# Patient Record
Sex: Male | Born: 1955 | Marital: Married | Smoking: Never smoker
Health system: Southern US, Community
[De-identification: ages and names within clinical notes are randomized; demographics above are authoritative.]

## PROBLEM LIST (undated history)

## (undated) ENCOUNTER — Emergency Department (HOSPITAL_COMMUNITY): Payer: Self-pay | Source: Home / Self Care

## (undated) DIAGNOSIS — E119 Type 2 diabetes mellitus without complications: Secondary | ICD-10-CM

## (undated) HISTORY — DX: Type 2 diabetes mellitus without complications: E11.9

---

## 2015-05-05 ENCOUNTER — Encounter: Payer: Self-pay | Admitting: Podiatry

## 2015-05-05 ENCOUNTER — Ambulatory Visit (INDEPENDENT_AMBULATORY_CARE_PROVIDER_SITE_OTHER): Payer: Medicare Other | Admitting: Podiatry

## 2015-05-05 ENCOUNTER — Ambulatory Visit (INDEPENDENT_AMBULATORY_CARE_PROVIDER_SITE_OTHER): Payer: Medicare Other

## 2015-05-05 VITALS — BP 157/82 | HR 75 | Temp 97.8°F | Resp 16

## 2015-05-05 DIAGNOSIS — Z899 Acquired absence of limb, unspecified: Secondary | ICD-10-CM | POA: Diagnosis not present

## 2015-05-05 DIAGNOSIS — R52 Pain, unspecified: Secondary | ICD-10-CM

## 2015-05-05 DIAGNOSIS — L97529 Non-pressure chronic ulcer of other part of left foot with unspecified severity: Secondary | ICD-10-CM

## 2015-05-05 DIAGNOSIS — E1149 Type 2 diabetes mellitus with other diabetic neurological complication: Secondary | ICD-10-CM

## 2015-05-05 DIAGNOSIS — E11621 Type 2 diabetes mellitus with foot ulcer: Secondary | ICD-10-CM | POA: Diagnosis not present

## 2015-05-05 MED ORDER — CLINDAMYCIN HCL 300 MG PO CAPS: 300.0000 mg | ORAL_CAPSULE | Freq: Three times a day (TID) | ORAL | Status: DC

## 2015-05-05 MED ORDER — SILVER SULFADIAZINE 1 % EX CREA: 1.0000 "application " | TOPICAL_CREAM | Freq: Every day | CUTANEOUS | Status: AC

## 2015-05-05 MED ORDER — CLINDAMYCIN HCL 300 MG PO CAPS: 300.0000 mg | ORAL_CAPSULE | Freq: Two times a day (BID) | ORAL | Status: DC

## 2015-05-05 NOTE — Progress Notes (Signed)
   Subjective:    Patient ID: Mitchell Davies, male    DOB: 02/18/1956, 59 y.o.   MRN: [id number]  HPI   59 year old male presents the office they with his wife for concerns of 2 ulcers the top of his left foot which has been ongoing for approximate one week. He states the areas didn't start as a blister. He has noticed some brownish drainage coming from the wound but denies any pus. Denies any surrounding redness or red streaks. He denies been on any antibiotics recently. He does. 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 No other complaints at this time.  Review of Systems  Constitutional: Positive for unexpected weight change.  HENT: Positive for sneezing.        Sinus problems,  Eyes: Positive for pain and visual disturbance.  Respiratory: Positive for cough, shortness of breath and wheezing.   Cardiovascular: Positive for palpitations and leg swelling.       Calf pain with walking  Gastrointestinal: Positive for nausea, diarrhea and constipation.  Genitourinary: Positive for urgency.  Musculoskeletal: Positive for gait problem.       Muscle pain,Joint pain  Skin:       Open sores  Allergic/Immunologic: Positive for environmental allergies and food allergies.  Neurological: Positive for weakness, numbness and headaches.       Objective:   Physical Exam General: AAO x3, NAD  Dermatological:  On the dorsal aspect of the left foot there are 2 annular superficial granular ulcerations. They are on the dorsal 1st MTPJ. On the more dorsal wound the wound measures 1.5 x 1 cm on the dorsal medial aspect measured 0.5 x 0.5 L. The wound bases are superficial and granular. There is no swelling erythema, ascending cellulitis, fluctuance, crepitus, malodor, drainage/purulence. There appears to be chronic edema to the left foot. There are no other open lesions or pre-ulcerative lesions.  Vascular: Dorsalis Pedis artery and Posterior Tibial artery pedal pulses  are 2/4 bilateral with immedate capillary fill time. There is no pain with calf compression, swelling, warmth, erythema.   Neruologic:  Absent protective and vibratory sensation.  Musculoskeletal:  Previous limitation of the left hallux as well as a right below-knee amputation.  Gait: Unassisted, Nonantalgic.      Assessment & Plan:   59 year old male left dorsal foot ulcerations -X-rays were obtained and reviewed with the patient.  There is no definitive evidence of acute fracture or stress fracture. -Treatment options discussed including all alternatives, risks, and complications -Given his history of drainage will start antibiotics. Prescribed clindamycin. -Recommend daily dressing changes with Silvadene. This is prescribed today. They also have some at home.  -Offloading the area. -Completed paperwork for precertification diabetic shoe -Monitor for any clinical signs or symptoms of infection and directed to call the office immediately should any occur or go to the ER. -Follow-up in 3 weeks or sooner if any problems arise. In the meantime, encouraged to call the office with any questions, concerns, change in symptoms. Follow-up with PCP for other issues mentioned in the review of systems.  Ovid CurdMatthew Nicolas Banh, DPM

## 2015-05-05 NOTE — Patient Instructions (Signed)
Monitor for any signs/symptoms of infection. Call the office immediately if any occur or go directly to the emergency room. Call with any questions/concerns.  

## 2015-05-06 ENCOUNTER — Encounter: Payer: Self-pay | Admitting: Podiatry

## 2015-05-06 DIAGNOSIS — L97529 Non-pressure chronic ulcer of other part of left foot with unspecified severity: Secondary | ICD-10-CM

## 2015-05-06 DIAGNOSIS — E11621 Type 2 diabetes mellitus with foot ulcer: Secondary | ICD-10-CM | POA: Insufficient documentation

## 2015-05-06 DIAGNOSIS — E1149 Type 2 diabetes mellitus with other diabetic neurological complication: Secondary | ICD-10-CM | POA: Insufficient documentation

## 2015-05-06 DIAGNOSIS — Z899 Acquired absence of limb, unspecified: Secondary | ICD-10-CM | POA: Insufficient documentation

## 2015-05-26 ENCOUNTER — Encounter: Payer: Self-pay | Admitting: Podiatry

## 2015-05-26 ENCOUNTER — Ambulatory Visit (INDEPENDENT_AMBULATORY_CARE_PROVIDER_SITE_OTHER): Payer: Medicare Other | Admitting: Podiatry

## 2015-05-26 VITALS — BP 141/83 | HR 56 | Resp 18

## 2015-05-26 DIAGNOSIS — Z899 Acquired absence of limb, unspecified: Secondary | ICD-10-CM

## 2015-05-26 DIAGNOSIS — E11621 Type 2 diabetes mellitus with foot ulcer: Secondary | ICD-10-CM | POA: Diagnosis not present

## 2015-05-26 DIAGNOSIS — L97529 Non-pressure chronic ulcer of other part of left foot with unspecified severity: Secondary | ICD-10-CM

## 2015-05-28 ENCOUNTER — Encounter: Payer: Self-pay | Admitting: Podiatry

## 2015-05-28 NOTE — Progress Notes (Signed)
Patient ID: Mitchell Davies, male   DOB: 09/29/1955, 59 y.o.   MRN: [id number]  Subjective: 59 year old male presents the office for follow-up evaluation of left foot ulcerations. He states in the wounds on the top of the foot have decrease in her resolving. He does state the swelling to his foot is also decreased.he has been using Silvadene daily He has been as his course of antibiotics. Denies any systemic complaints as fevers, chills, nausea, vomiting. No calf pain, chest pain, shortness of breath.  Objective: AAO 3, NAD  DP/PT pulses palpable, CRT less than 3 seconds Previous right below-knee amputation Protective sensation absent with Mitchell Davies Weinstein monofilament and vibratory sensation. On the dorsal aspect of the left foot the more dorsal wound appears to be decreased in size today measuring 0.9 x 0.4 cm in the superficial without any swelling erythema, ascending cellulitis, questions, crepitus, malodor, drainage or purulence. On the medial dorsal wound is a hyperkeratotic lesion. Upon debridement underlying ulceration appears to be almost healed is only a small pinpoint opening. Again there is no probing, undermining or drainage or other signs of infection. The edema to the foot has decreased. No other open lesions or pre-ulcerative lesions. No pain with calf compression, swelling, warmth, erythema  Assessment: 59 year old male resolving ulcers left foot  Plan: -Treatment options discussed including all alternatives, risks, and complications -Once debrided to healthy wound base. Continue Silvadene dressing changes daily. Monitor for any clinical signs or symptoms of infection and directed to call the office immediately should any occur or go to the ER. -Follow-up as scheduled or sooner if any problems arise. In the meantime, encouraged to call the office with any questions, concerns, change in symptoms.   Ovid CurdMatthew Wagoner, DPM

## 2015-06-16 ENCOUNTER — Encounter: Payer: Self-pay | Admitting: Podiatry

## 2015-06-16 ENCOUNTER — Ambulatory Visit (INDEPENDENT_AMBULATORY_CARE_PROVIDER_SITE_OTHER): Payer: Medicare Other | Admitting: Podiatry

## 2015-06-16 VITALS — BP 163/86 | HR 66 | Temp 96.3°F | Resp 16

## 2015-06-16 DIAGNOSIS — L97529 Non-pressure chronic ulcer of other part of left foot with unspecified severity: Principal | ICD-10-CM

## 2015-06-16 DIAGNOSIS — L89891 Pressure ulcer of other site, stage 1: Secondary | ICD-10-CM

## 2015-06-16 DIAGNOSIS — E11621 Type 2 diabetes mellitus with foot ulcer: Secondary | ICD-10-CM

## 2015-06-17 ENCOUNTER — Encounter: Payer: Self-pay | Admitting: Podiatry

## 2015-06-17 NOTE — Progress Notes (Signed)
Patient ID: Leonidas Boateng, male   DOB: September 20, 1955, 60 y.o.   MRN: [id number]  Subjective: 60 year old male presents the office for follow-up evaluation of left foot ulcerations. He has continued  With Silvadene to the wound daily. He said the wound does  Continue to heal. Denies any drainage or pus. Denies he redness or warmth to his foot. Denies any systemic complaints as fevers, chills, nausea, vomiting. No calf pain, chest pain, shortness of breath.  Objective: AAO 3, NAD  DP/PT pulses palpable, CRT less than 3 seconds Previous right below-knee amputation Protective sensation absent with Dorann Ou monofilament and vibratory sensation. On the dorsal aspect of the left foot the more dorsal wound appears to be decreased in size today measuring 0.6 x 0.1 cm in the superficial without any surrounding erythema, ascending cellulitis, questions, crepitus, malodor, drainage or purulence. On the medial dorsal wound is a hyperkeratotic lesion. Upon debridement underlying ulceration appears to be healed at this time. Again there is no probing, undermining or drainage or other signs of infection. No significant edema to the foot and there is no warmth to the foot.  No other open lesions or pre-ulcerative lesions. No pain with calf compression, swelling, warmth, erythema  Assessment: 60 year old male resolving ulcers left foot  Plan: -Treatment options discussed including all alternatives, risks, and complications -Once debrided to healthy wound base. Continue Silvadene dressing changes daily. Monitor for any clinical signs or symptoms of infection and directed to call the office immediately should any occur or go to the ER. -Follow-up as scheduled or sooner if any problems arise. In the meantime, encouraged to call the office with any questions, concerns, change in symptoms.   Ovid Curd, DPM

## 2015-06-30 ENCOUNTER — Ambulatory Visit: Payer: Medicare Other | Admitting: *Deleted

## 2015-06-30 DIAGNOSIS — E11621 Type 2 diabetes mellitus with foot ulcer: Secondary | ICD-10-CM

## 2015-06-30 DIAGNOSIS — L97529 Non-pressure chronic ulcer of other part of left foot with unspecified severity: Principal | ICD-10-CM

## 2015-06-30 NOTE — Progress Notes (Signed)
Patient ID: Mitchell Davies, male   DOB: [birth year], 60 y.o.   MRN: [id number] Patient presents to be scanned and measured for diabetic shoes and inserts.

## 2015-07-14 ENCOUNTER — Ambulatory Visit: Payer: Medicare Other | Admitting: Podiatry

## 2015-08-24 ENCOUNTER — Ambulatory Visit: Payer: Medicare Other

## 2015-08-25 ENCOUNTER — Ambulatory Visit: Payer: Medicare Other

## 2015-08-28 ENCOUNTER — Ambulatory Visit (INDEPENDENT_AMBULATORY_CARE_PROVIDER_SITE_OTHER): Payer: Medicare Other | Admitting: Podiatry

## 2015-08-28 DIAGNOSIS — E1149 Type 2 diabetes mellitus with other diabetic neurological complication: Secondary | ICD-10-CM | POA: Diagnosis not present

## 2015-08-28 DIAGNOSIS — Z89431 Acquired absence of right foot: Secondary | ICD-10-CM

## 2015-08-28 DIAGNOSIS — M2042 Other hammer toe(s) (acquired), left foot: Secondary | ICD-10-CM

## 2015-08-28 NOTE — Progress Notes (Signed)
Patient ID: Mitchell GrippeKevin Glenn Davies, male   DOB: 12/15/1955, 60 y.o.   MRN: [id number] Patient presents for diabetic shoe pick up, shoes are tried on for good fit.  Patient received 1 Pair [employer] [address] black in men's size 14 extra extra wide and 3 custom molded diabetic inserts.  Patient has right lower leg amputation.  Verbal and written break in and wear instructions given.  Patient will follow up for scheduled routine care. He had no other complaints today and denies any acute changes.

## 2015-08-28 NOTE — Patient Instructions (Signed)

## 2015-12-07 ENCOUNTER — Ambulatory Visit (INDEPENDENT_AMBULATORY_CARE_PROVIDER_SITE_OTHER): Payer: Medicare Other | Admitting: Podiatry

## 2015-12-07 ENCOUNTER — Encounter: Payer: Self-pay | Admitting: Podiatry

## 2015-12-07 ENCOUNTER — Ambulatory Visit (INDEPENDENT_AMBULATORY_CARE_PROVIDER_SITE_OTHER): Payer: Medicare Other

## 2015-12-07 VITALS — BP 149/80 | HR 64 | Resp 12

## 2015-12-07 DIAGNOSIS — L03032 Cellulitis of left toe: Secondary | ICD-10-CM

## 2015-12-07 DIAGNOSIS — M2042 Other hammer toe(s) (acquired), left foot: Secondary | ICD-10-CM | POA: Diagnosis not present

## 2015-12-07 DIAGNOSIS — L97521 Non-pressure chronic ulcer of other part of left foot limited to breakdown of skin: Secondary | ICD-10-CM

## 2015-12-07 MED ORDER — AMOXICILLIN-POT CLAVULANATE 875-125 MG PO TABS: 1.0000 | ORAL_TABLET | Freq: Two times a day (BID) | ORAL | Status: DC

## 2015-12-07 NOTE — Progress Notes (Signed)
Patient ID: Mitchell Davies, male   DOB: 06/15/1955, 60 y.o.   MRN: [id number]  Subjective: 60 year old male presents the office today for concerns of the wound to left second toe which has been ongoing for about 1 week. His wife is no some bloody drainage coming from the toe as well as swelling to the toe. He denies any injury to the toe. Denies any red streaks or any swelling to the other areas of the foot. The wounds that he previously did heel is done well from this. The patient's wife does not think this came for diabetic shoes but is unsure of how it started. Denies he redness or warmth to his foot. Denies any systemic complaints as fevers, chills, nausea, vomiting. No calf pain, chest pain, shortness of breath.  Objective: AAO 3, NAD  DP/PT pulses palpable, CRT less than 3 seconds Previous right below-knee amputation Protective sensation absent with Dorann OuSimms Weinstein monofilament and vibratory sensation. Wounds that were present previously have healed. At the distal aspect of the left second toe is what appears to be an old blister. Upon debridement there is a superficial granular wound site measuring 1 x 0.5 cm there is no probing to bone, undermining or tunneling. There is no surrounding erythema or increase in warmth. No ascending synovitis. Mild edema to the toe. No other open lesions or pre-ulcerative lesions identified. No pain with calf compression, swelling, warmth, erythema  Assessment: 60 year old male second edema, ulceration  Plan: -Treatment options discussed including all alternatives, risks, and complications -Etiology of symptoms were discussed -X-rays were obtained and reviewed with the patient. The definitive evidence of cortical changes suggestive osteomy let us this time. -Wound was debrided without couple complications. Continue in about ointment dressing changes of the wound daily. -Will start Augmentin -Offloading -We're recast him for diabetic inserts to further  offload this area. -Follow up in 2 weeks or sooner if needed. He signs or symptoms of worsening infection of the ER.  Ovid CurdMatthew Wagoner, DPM

## 2015-12-21 ENCOUNTER — Ambulatory Visit (INDEPENDENT_AMBULATORY_CARE_PROVIDER_SITE_OTHER): Payer: Medicare Other | Admitting: Podiatry

## 2015-12-21 ENCOUNTER — Encounter: Payer: Self-pay | Admitting: Podiatry

## 2015-12-21 DIAGNOSIS — L97521 Non-pressure chronic ulcer of other part of left foot limited to breakdown of skin: Secondary | ICD-10-CM

## 2015-12-21 MED ORDER — AMOXICILLIN-POT CLAVULANATE 875-125 MG PO TABS: 1.0000 | ORAL_TABLET | Freq: Two times a day (BID) | ORAL | 0 refills | Status: AC

## 2015-12-26 NOTE — Progress Notes (Signed)
Patient ID: Mitchell Davies, male   DOB: 1956-05-22, 61 y.o.   MRN: [id number]  Subjective: 60 year old male presents the office today for follow-up evaluation of a wound to the left second toe. He states this been doing much better his continue with Augmentin. Denies any drainage or pus or any swelling to the toe. Denies any systemic complaints as fevers, chills, nausea, vomiting. No calf pain, chest pain, shortness of breath.  Objective: AAO 3, NAD  DP/PT pulses palpable, CRT less than 3 seconds Previous right below-knee amputation Protective sensation absent with Dorann Ou monofilament and vibratory sensation. Wounds that were present previously have healed. At the distal aspect of the left second toe is a superficial granular wound without any probing, undermining or tunneling. There is no swelling erythema, ascending saline disc, fluctuance, crepitus, malodor. There is no edema to the toe. No other open lesions or pre-ulcerative lesions identified. No pain with calf compression, swelling, warmth, erythema  Assessment: 60 year old male second edema, ulceration  Plan: -Treatment options discussed including all alternatives, risks, and complications -Wound was lightly debrided to remove small mild hyperkeratotic tissue and the area. Continue with offloading pads. No clinical signs of infection at this time. Monitor for any signs or symptoms of worsening infection of the ER any occur. Follow-up with me as scheduled or sooner if any issues are to arise. Call any questions or concerns or meantime.  Ovid Curd, DPM

## 2016-01-04 ENCOUNTER — Encounter: Payer: Self-pay | Admitting: Podiatry

## 2016-01-04 ENCOUNTER — Ambulatory Visit (INDEPENDENT_AMBULATORY_CARE_PROVIDER_SITE_OTHER): Payer: Medicare Other | Admitting: Podiatry

## 2016-01-04 DIAGNOSIS — E11621 Type 2 diabetes mellitus with foot ulcer: Secondary | ICD-10-CM | POA: Diagnosis not present

## 2016-01-04 DIAGNOSIS — L89891 Pressure ulcer of other site, stage 1: Secondary | ICD-10-CM

## 2016-01-04 DIAGNOSIS — L97529 Non-pressure chronic ulcer of other part of left foot with unspecified severity: Principal | ICD-10-CM

## 2016-01-04 NOTE — Progress Notes (Signed)
Patient ID: Mitchell Davies, male   DOB: 07/21/1955, 60 y.o.   MRN: [id number]  Subjective: 60 year old male presents the office today for follow-up evaluation of a wound to the left second toe. There is one that he noticed some bloody drainage began to shower and that he is in no drainage. He denies any redness the Laveda Normanoney states the swelling has improved. Denies any pain. Denies any systemic complaints as fevers, chills, nausea, vomiting. No calf pain, chest pain, shortness of breath.  Objective: AAO 3, NAD  DP/PT pulses palpable, CRT less than 3 seconds Previous right below-knee amputation Protective sensation absent with Dorann OuSimms Weinstein monofilament Distal aspect the left second toe is a pinpoint superficial opening. Small mild hyperkeratotic periwound. After debridement the wound measures about 0.1 x 0.1 synovators. There is no probing, undermining or tunneling. There is no drainage or pus expressed. This point edema to the toe without any erythema or ascending cellulitis. There is no malodor. No fluctuance or crepitus. No other open lesions or pre-ulcerative lesions.  No pain with calf compression, swelling, warmth, erythema  Assessment: 60 year old male resolving second digit edema, ulceration  Plan: -Treatment options discussed including all alternatives, risks, and complications -Wound was lightly debrided to remove small mild hyperkeratotic tissue and the area. This was applied followed by dressing. Continue offloading. Continue daily dressing changes as home. -Monitor for signs or symptoms of infection and directed to the ER should any occur call the office. -Follow-up in 4 weeks or sooner if any problems arise. In the meantime, encouraged to call the office with any questions, concerns, change in symptoms.   Mitchell Davies, DPM

## 2016-02-05 ENCOUNTER — Ambulatory Visit: Payer: Medicare Other | Admitting: Podiatry

## 2016-02-08 ENCOUNTER — Ambulatory Visit: Payer: Medicare Other | Admitting: Podiatry

## 2016-02-19 ENCOUNTER — Telehealth: Payer: Self-pay | Admitting: *Deleted

## 2016-02-19 NOTE — Telephone Encounter (Signed)
Please let them know that [place] but I will be more than happy to see him once discharged.

## 2016-02-19 NOTE — Telephone Encounter (Addendum)
Pt's wife, Velna HatchetSheila states [place]. Velna HatchetSheila states they had thought the left 2nd toe had healed,but is now being treated for osteomyelitis and blood. I inform Velna HatchetSheila, of Dr. Gabriel RungWagoner's recommendation. Velna HatchetSheila states pt will be receiving IV antibiotics at home and will have an MRI outside of the hospital, and they will reschedule with Dr. Ardelle AntonWagoner in the future.

## 2016-02-22 ENCOUNTER — Ambulatory Visit: Payer: Medicare Other | Admitting: Podiatry

## 2017-02-07 ENCOUNTER — Ambulatory Visit: Payer: Self-pay | Admitting: Physician Assistant

## 2019-03-22 ENCOUNTER — Inpatient Hospital Stay
Admission: RE | Admit: 2019-03-22 | Discharge: 2019-04-08 | Disposition: A | Discharge status: Rehabilitation Facility | Payer: Medicare Other | Source: Other Acute Inpatient Hospital | Attending: Internal Medicine | Admitting: Internal Medicine

## 2019-03-22 DIAGNOSIS — R0602 Shortness of breath: Secondary | ICD-10-CM

## 2019-03-22 DIAGNOSIS — J9690 Respiratory failure, unspecified, unspecified whether with hypoxia or hypercapnia: Secondary | ICD-10-CM

## 2019-03-22 DIAGNOSIS — R079 Chest pain, unspecified: Secondary | ICD-10-CM

## 2019-03-22 DIAGNOSIS — J189 Pneumonia, unspecified organism: Secondary | ICD-10-CM

## 2019-03-22 LAB — BLOOD GAS, ARTERIAL
Acid-Base Excess: 3.2 mmol/L — ABNORMAL HIGH (ref 0.0–2.0)
Bicarbonate: 29.3 mmol/L — ABNORMAL HIGH (ref 20.0–28.0)
Delivery systems: POSITIVE
Expiratory PAP: 10
FIO2: 0.28
Inspiratory PAP: 12
O2 Saturation: 92.6 %
Patient temperature: 98.6
RATE: 16 resp/min
pCO2 arterial: 63.9 mmHg — ABNORMAL HIGH (ref 32.0–48.0)
pH, Arterial: 7.284 — ABNORMAL LOW (ref 7.350–7.450)
pO2, Arterial: 64.5 mmHg — ABNORMAL LOW (ref 83.0–108.0)

## 2019-03-23 ENCOUNTER — Other Ambulatory Visit (HOSPITAL_COMMUNITY): Payer: Medicare Other

## 2019-03-23 LAB — CBC
HCT: 33.1 % — ABNORMAL LOW (ref 39.0–52.0)
Hemoglobin: 10.3 g/dL — ABNORMAL LOW (ref 13.0–17.0)
MCH: 29.3 pg (ref 26.0–34.0)
MCHC: 31.1 g/dL (ref 30.0–36.0)
MCV: 94 fL (ref 80.0–100.0)
Platelets: 168 10*3/uL (ref 150–400)
RBC: 3.52 MIL/uL — ABNORMAL LOW (ref 4.22–5.81)
RDW: 16.2 % — ABNORMAL HIGH (ref 11.5–15.5)
WBC: 11 10*3/uL — ABNORMAL HIGH (ref 4.0–10.5)
nRBC: 0.2 % (ref 0.0–0.2)

## 2019-03-23 LAB — COMPREHENSIVE METABOLIC PANEL
ALT: 23 U/L (ref 0–44)
AST: 22 U/L (ref 15–41)
Albumin: 2.1 g/dL — ABNORMAL LOW (ref 3.5–5.0)
Alkaline Phosphatase: 182 U/L — ABNORMAL HIGH (ref 38–126)
Anion gap: 7 (ref 5–15)
BUN: 27 mg/dL — ABNORMAL HIGH (ref 8–23)
CO2: 27 mmol/L (ref 22–32)
Calcium: 9 mg/dL (ref 8.9–10.3)
Chloride: 104 mmol/L (ref 98–111)
Creatinine, Ser: 2.46 mg/dL — ABNORMAL HIGH (ref 0.61–1.24)
GFR calc Af Amer: 31 mL/min — ABNORMAL LOW (ref >60)
GFR calc non Af Amer: 27 mL/min — ABNORMAL LOW (ref >60)
Glucose, Bld: 163 mg/dL — ABNORMAL HIGH (ref 70–99)
Potassium: 4.1 mmol/L (ref 3.5–5.1)
Sodium: 138 mmol/L (ref 135–145)
Total Bilirubin: 0.7 mg/dL (ref 0.3–1.2)
Total Protein: 6.8 g/dL (ref 6.5–8.1)

## 2019-03-23 LAB — BLOOD GAS, ARTERIAL
Acid-Base Excess: 3.6 mmol/L — ABNORMAL HIGH (ref 0.0–2.0)
Bicarbonate: 29.6 mmol/L — ABNORMAL HIGH (ref 20.0–28.0)
Delivery systems: POSITIVE
Expiratory PAP: 10
FIO2: 0.3
Inspiratory PAP: 22
O2 Saturation: 94.4 %
Patient temperature: 98.6
RATE: 23 resp/min
pCO2 arterial: 61.8 mmHg — ABNORMAL HIGH (ref 32.0–48.0)
pH, Arterial: 7.301 — ABNORMAL LOW (ref 7.350–7.450)
pO2, Arterial: 70 mmHg — ABNORMAL LOW (ref 83.0–108.0)

## 2019-03-23 LAB — VANCOMYCIN, TROUGH: Vancomycin Tr: 10 ug/mL — ABNORMAL LOW (ref 15–20)

## 2019-03-25 ENCOUNTER — Other Ambulatory Visit (HOSPITAL_COMMUNITY): Payer: Medicare Other

## 2019-03-25 LAB — BLOOD GAS, ARTERIAL
Acid-Base Excess: 6 mmol/L — ABNORMAL HIGH (ref 0.0–2.0)
Bicarbonate: 32.5 mmol/L — ABNORMAL HIGH (ref 20.0–28.0)
Delivery systems: POSITIVE
Expiratory PAP: 10
FIO2: 0.4
Inspiratory PAP: 24
Mode: POSITIVE
O2 Saturation: 93.1 %
Patient temperature: 98.6
RATE: 22 resp/min
pCO2 arterial: 71.9 mmHg (ref 32.0–48.0)
pH, Arterial: 7.277 — ABNORMAL LOW (ref 7.350–7.450)
pO2, Arterial: 72.5 mmHg — ABNORMAL LOW (ref 83.0–108.0)

## 2019-03-25 LAB — BASIC METABOLIC PANEL
Anion gap: 10 (ref 5–15)
BUN: 23 mg/dL (ref 8–23)
CO2: 27 mmol/L (ref 22–32)
Calcium: 8.9 mg/dL (ref 8.9–10.3)
Chloride: 103 mmol/L (ref 98–111)
Creatinine, Ser: 2.16 mg/dL — ABNORMAL HIGH (ref 0.61–1.24)
GFR calc Af Amer: 36 mL/min — ABNORMAL LOW (ref >60)
GFR calc non Af Amer: 31 mL/min — ABNORMAL LOW (ref >60)
Glucose, Bld: 210 mg/dL — ABNORMAL HIGH (ref 70–99)
Potassium: 4.1 mmol/L (ref 3.5–5.1)
Sodium: 140 mmol/L (ref 135–145)

## 2019-03-25 LAB — TROPONIN I (HIGH SENSITIVITY)
Troponin I (High Sensitivity): 84 ng/L — ABNORMAL HIGH (ref <18)
Troponin I (High Sensitivity): 96 ng/L — ABNORMAL HIGH (ref <18)

## 2019-03-25 NOTE — Consult Note (Signed)
Referring Physician: Dr. Daine Gravel. [name] is an 63 y.o. male.                       Chief Complaint: Bradycardia  HPI: 63 years old black male has PMH of acute on chronic respiratory failure with continuous BIPAP use, Pneumonia, diastolic left heart failure, CAD, CABG, Acute on chronic kidney disease, Paroxysmal atrial fibrillation, S/P cardioversion, type 2 DM with hyperglycemia, Severe pulmonary hypertension, Hypothyroidism, Hyperlipidemia, Left AKA and right BKA. He has episodes of sinus bradycardia at rest with preserved systolic blood pressure.  Past Medical History:  Diagnosis Date  . Diabetes mellitus without complication (HCC)     Past surgical history : as per HPI.  No family history on file. Social History:  reports that he has never smoked. He does not have any smokeless tobacco history on file. He reports that he does not drink alcohol or use drugs.  Allergies:  Allergies  Allergen Reactions  . Fexofenadine Hcl Anaphylaxis  . Latex Rash    Tape-adhesive  . Other Hives, Nausea Only and Other (See Comments)    pork  . Benzonatate Swelling  . Fluorescein Other (See Comments)  . Seasonal Ic [Cholestatin]   . Levothyroxine Hives, Itching and Rash    Medications Prior to Admission  Medication Sig Dispense Refill  . amiodarone (PACERONE) 200 MG tablet Take by mouth.    Marland Kitchen amoxicillin-clavulanate (AUGMENTIN) 875-125 MG tablet Take 1 tablet by mouth 2 (two) times daily. 20 tablet 0  . apixaban (ELIQUIS) 5 MG TABS tablet Take by mouth.    Marland Kitchen aspirin EC 81 MG tablet Take by mouth.    Marland Kitchen atorvastatin (LIPITOR) 80 MG tablet Take by mouth.    . cetirizine (ZYRTEC) 10 MG tablet Take by mouth.    . clopidogrel (PLAVIX) 75 MG tablet Take by mouth.    . insulin aspart (NOVOLOG) 100 UNIT/ML injection Inject into the skin.    Marland Kitchen levothyroxine (SYNTHROID, LEVOTHROID) 50 MCG tablet     . metoprolol (LOPRESSOR) 50 MG tablet 2 (two) times daily.     . montelukast  (SINGULAIR) 10 MG tablet Take by mouth.    . silver sulfADIAZINE (SILVADENE) 1 % cream Apply 1 application topically daily. 50 g 0  . torsemide (DEMADEX) 20 MG tablet Take by mouth.      Results for orders placed or performed during the hospital encounter of 03/22/19 (from the past 48 hour(s))  Basic metabolic panel     Status: Abnormal   Collection Time: 03/25/19  8:49 AM  Result Value Ref Range   Sodium 140 135 - 145 mmol/L   Potassium 4.1 3.5 - 5.1 mmol/L   Chloride 103 98 - 111 mmol/L   CO2 27 22 - 32 mmol/L   Glucose, Bld 210 (H) 70 - 99 mg/dL   BUN 23 8 - 23 mg/dL   Creatinine, Ser 9.76 (H) 0.61 - 1.24 mg/dL   Calcium 8.9 8.9 - 73.4 mg/dL   GFR calc non Af Amer 31 (L) >60 mL/min   GFR calc Af Amer 36 (L) >60 mL/min   Anion gap 10 5 - 15    Comment: Performed at [facility], [address], [place], [place] [address]  Blood gas, arterial     Status: Abnormal   Collection Time: 03/25/19  1:53 PM  Result Value Ref Range   FIO2 0.40    Delivery systems BILEVEL POSITIVE AIRWAY PRESSURE  Mode BILEVEL POSITIVE AIRWAY PRESSURE    LHR 22 resp/min   Inspiratory PAP 24    Expiratory PAP 10    pH, Arterial 7.277 (L) 7.350 - 7.450   pCO2 arterial 71.9 (HH) 32.0 - 48.0 mmHg    Comment: RBV, KEISHA CLARKE, RT ON 03/25/19 AT 1400 BY MENDIE CAMPBELL, RRT, RCP   pO2, Arterial 72.5 (L) 83.0 - 108.0 mmHg   Bicarbonate 32.5 (H) 20.0 - 28.0 mmol/L   Acid-Base Excess 6.0 (H) 0.0 - 2.0 mmol/L   O2 Saturation 93.1 %   Patient temperature 98.6    Collection site RIGHT RADIAL    Drawn by DRAWN BY RN    Sample type ARTERIAL DRAW    Allens test (pass/fail) PASS PASS   Dg Chest Port 1 View  Result Date: 03/25/2019 CLINICAL DATA:  Chest pain and shortness of breath. EXAM: PORTABLE CHEST 1 VIEW COMPARISON:  03/23/2019. FINDINGS: Trachea is midline. Heart is enlarged. Diffuse interstitial prominence indistinctness, with somewhat of a nodular appearance. No definite pleural fluid.  IMPRESSION: Diffuse interstitial prominence and indistinctness, with somewhat of a nodular appearance, unchanged from 03/23/2019. Findings may be due to pulmonary edema but other etiologies such as a viral or atypical pneumonia are not excluded. Electronically Signed   By: Lorin Picket M.D.   On: 03/25/2019 10:57    Review Of Systems Constitutional: Positive fever, chills, weight loss or gain. Eyes: No vision change, wears glasses. No discharge or pain. Ears: No hearing loss, No tinnitus. Respiratory: Positive asthma, COPD, pneumonias, shortness of breath. No hemoptysis. Positive OSA. Cardiovascular: Positive chest pain, palpitation, bilateral leg amputations. Gastrointestinal: No nausea, vomiting, diarrhea, constipation. No GI bleed. No hepatitis. Genitourinary: No dysuria, hematuria, kidney stone. No incontinance. Positive Kidney injury. Neurological: No headache, stroke, seizures.  Psychiatry: No psych facility admission for anxiety, depression, suicide. No detox. Skin: No rash. Musculoskeletal: Positive joint pain, no fibromyalgia. No neck pain, back pain. Lymphadenopathy: No lymphadenopathy. Hematology: Positive anemia. No easy bruising.  P: 45, R: 20, BP: 125/80. There were no vitals taken for this visit. There is no height or weight on file to calculate BMI. General appearance: alert, cooperative, appears stated age and moderate distress Head: Normocephalic, atraumatic. Eyes: Brown eyes, pale pink conjunctiva, corneas clear. Neck: No adenopathy, no carotid bruit, no JVD, supple, symmetrical, trachea midline and thyroid not enlarged. Resp: Clear to auscultation bilaterally. Cardio: Regular rate and rhythm, S1, S2 normal, II/VI systolic murmur, no click, rub or gallop GI: Soft, non-tender; bowel sounds normal; no organomegaly. Extremities: Left AKA, Right BKA. No edema, cyanosis or clubbing. Skin: Warm and dry.  Neurologic: Alert and oriented X 0.  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 Consider beta-blocking eye drops change. Recheck TSH and increase synthroid if needed.   Time spent: Review of old records, Lab, x-rays, EKG, other cardiac tests, examination, discussion with patient over 70 minutes.  Birdie Riddle, MD  03/25/2019, 6:18 PM

## 2019-03-26 LAB — BASIC METABOLIC PANEL
Anion gap: 11 (ref 5–15)
BUN: 25 mg/dL — ABNORMAL HIGH (ref 8–23)
CO2: 28 mmol/L (ref 22–32)
Calcium: 9 mg/dL (ref 8.9–10.3)
Chloride: 103 mmol/L (ref 98–111)
Creatinine, Ser: 2.53 mg/dL — ABNORMAL HIGH (ref 0.61–1.24)
GFR calc Af Amer: 30 mL/min — ABNORMAL LOW (ref >60)
GFR calc non Af Amer: 26 mL/min — ABNORMAL LOW (ref >60)
Glucose, Bld: 123 mg/dL — ABNORMAL HIGH (ref 70–99)
Potassium: 3.5 mmol/L (ref 3.5–5.1)
Sodium: 142 mmol/L (ref 135–145)

## 2019-03-26 LAB — BLOOD GAS, ARTERIAL
Acid-Base Excess: 7.8 mmol/L — ABNORMAL HIGH (ref 0.0–2.0)
Bicarbonate: 33.3 mmol/L — ABNORMAL HIGH (ref 20.0–28.0)
O2 Content: 4 L/min
O2 Saturation: 95.3 %
Patient temperature: 98.6
pCO2 arterial: 61.7 mmHg — ABNORMAL HIGH (ref 32.0–48.0)
pH, Arterial: 7.352 (ref 7.350–7.450)
pO2, Arterial: 77 mmHg — ABNORMAL LOW (ref 83.0–108.0)

## 2019-03-26 LAB — TROPONIN I (HIGH SENSITIVITY): Troponin I (High Sensitivity): 69 ng/L — ABNORMAL HIGH (ref <18)

## 2019-03-27 LAB — CBC
HCT: 30.2 % — ABNORMAL LOW (ref 39.0–52.0)
Hemoglobin: 9.1 g/dL — ABNORMAL LOW (ref 13.0–17.0)
MCH: 28.7 pg (ref 26.0–34.0)
MCHC: 30.1 g/dL (ref 30.0–36.0)
MCV: 95.3 fL (ref 80.0–100.0)
Platelets: 184 10*3/uL (ref 150–400)
RBC: 3.17 MIL/uL — ABNORMAL LOW (ref 4.22–5.81)
RDW: 16.7 % — ABNORMAL HIGH (ref 11.5–15.5)
WBC: 12 10*3/uL — ABNORMAL HIGH (ref 4.0–10.5)
nRBC: 0.3 % — ABNORMAL HIGH (ref 0.0–0.2)

## 2019-03-27 LAB — TSH: TSH: 3.58 u[IU]/mL (ref 0.350–4.500)

## 2019-03-27 LAB — T4, FREE: Free T4: 0.97 ng/dL (ref 0.61–1.12)

## 2019-03-28 ENCOUNTER — Other Ambulatory Visit (HOSPITAL_COMMUNITY): Payer: Medicare Other

## 2019-03-28 LAB — BASIC METABOLIC PANEL
Anion gap: 10 (ref 5–15)
BUN: 28 mg/dL — ABNORMAL HIGH (ref 8–23)
CO2: 31 mmol/L (ref 22–32)
Calcium: 8.7 mg/dL — ABNORMAL LOW (ref 8.9–10.3)
Chloride: 98 mmol/L (ref 98–111)
Creatinine, Ser: 2.5 mg/dL — ABNORMAL HIGH (ref 0.61–1.24)
GFR calc Af Amer: 31 mL/min — ABNORMAL LOW (ref >60)
GFR calc non Af Amer: 26 mL/min — ABNORMAL LOW (ref >60)
Glucose, Bld: 214 mg/dL — ABNORMAL HIGH (ref 70–99)
Potassium: 3.1 mmol/L — ABNORMAL LOW (ref 3.5–5.1)
Sodium: 139 mmol/L (ref 135–145)

## 2019-03-30 LAB — POTASSIUM: Potassium: 3 mmol/L — ABNORMAL LOW (ref 3.5–5.1)

## 2019-03-31 LAB — POTASSIUM: Potassium: 4 mmol/L (ref 3.5–5.1)

## 2019-04-03 LAB — BASIC METABOLIC PANEL
Anion gap: 9 (ref 5–15)
BUN: 35 mg/dL — ABNORMAL HIGH (ref 8–23)
CO2: 34 mmol/L — ABNORMAL HIGH (ref 22–32)
Calcium: 9.1 mg/dL (ref 8.9–10.3)
Chloride: 96 mmol/L — ABNORMAL LOW (ref 98–111)
Creatinine, Ser: 2.25 mg/dL — ABNORMAL HIGH (ref 0.61–1.24)
GFR calc Af Amer: 35 mL/min — ABNORMAL LOW (ref >60)
GFR calc non Af Amer: 30 mL/min — ABNORMAL LOW (ref >60)
Glucose, Bld: 174 mg/dL — ABNORMAL HIGH (ref 70–99)
Potassium: 3.9 mmol/L (ref 3.5–5.1)
Sodium: 139 mmol/L (ref 135–145)

## 2019-04-03 LAB — CBC
HCT: 31.3 % — ABNORMAL LOW (ref 39.0–52.0)
Hemoglobin: 9.5 g/dL — ABNORMAL LOW (ref 13.0–17.0)
MCH: 28.9 pg (ref 26.0–34.0)
MCHC: 30.4 g/dL (ref 30.0–36.0)
MCV: 95.1 fL (ref 80.0–100.0)
Platelets: 197 10*3/uL (ref 150–400)
RBC: 3.29 MIL/uL — ABNORMAL LOW (ref 4.22–5.81)
RDW: 18 % — ABNORMAL HIGH (ref 11.5–15.5)
WBC: 9.2 10*3/uL (ref 4.0–10.5)
nRBC: 0 % (ref 0.0–0.2)

## 2019-04-07 LAB — BASIC METABOLIC PANEL
Anion gap: 8 (ref 5–15)
BUN: 36 mg/dL — ABNORMAL HIGH (ref 8–23)
CO2: 35 mmol/L — ABNORMAL HIGH (ref 22–32)
Calcium: 9.1 mg/dL (ref 8.9–10.3)
Chloride: 98 mmol/L (ref 98–111)
Creatinine, Ser: 2.11 mg/dL — ABNORMAL HIGH (ref 0.61–1.24)
GFR calc Af Amer: 37 mL/min — ABNORMAL LOW (ref >60)
GFR calc non Af Amer: 32 mL/min — ABNORMAL LOW (ref >60)
Glucose, Bld: 111 mg/dL — ABNORMAL HIGH (ref 70–99)
Potassium: 3.9 mmol/L (ref 3.5–5.1)
Sodium: 141 mmol/L (ref 135–145)

## 2019-04-07 LAB — CBC
HCT: 29.7 % — ABNORMAL LOW (ref 39.0–52.0)
Hemoglobin: 8.9 g/dL — ABNORMAL LOW (ref 13.0–17.0)
MCH: 29 pg (ref 26.0–34.0)
MCHC: 30 g/dL (ref 30.0–36.0)
MCV: 96.7 fL (ref 80.0–100.0)
Platelets: 181 10*3/uL (ref 150–400)
RBC: 3.07 MIL/uL — ABNORMAL LOW (ref 4.22–5.81)
RDW: 18.5 % — ABNORMAL HIGH (ref 11.5–15.5)
WBC: 8.9 10*3/uL (ref 4.0–10.5)
nRBC: 0 % (ref 0.0–0.2)

## 2020-11-05 IMAGING — DX DG CHEST 1V PORT
1 series · 1 of 1 positions shown · non-contrast
Comparison: 03/23/2019.

CLINICAL DATA: Chest pain and shortness of breath.

EXAM:
PORTABLE CHEST 1 VIEW

[chest]
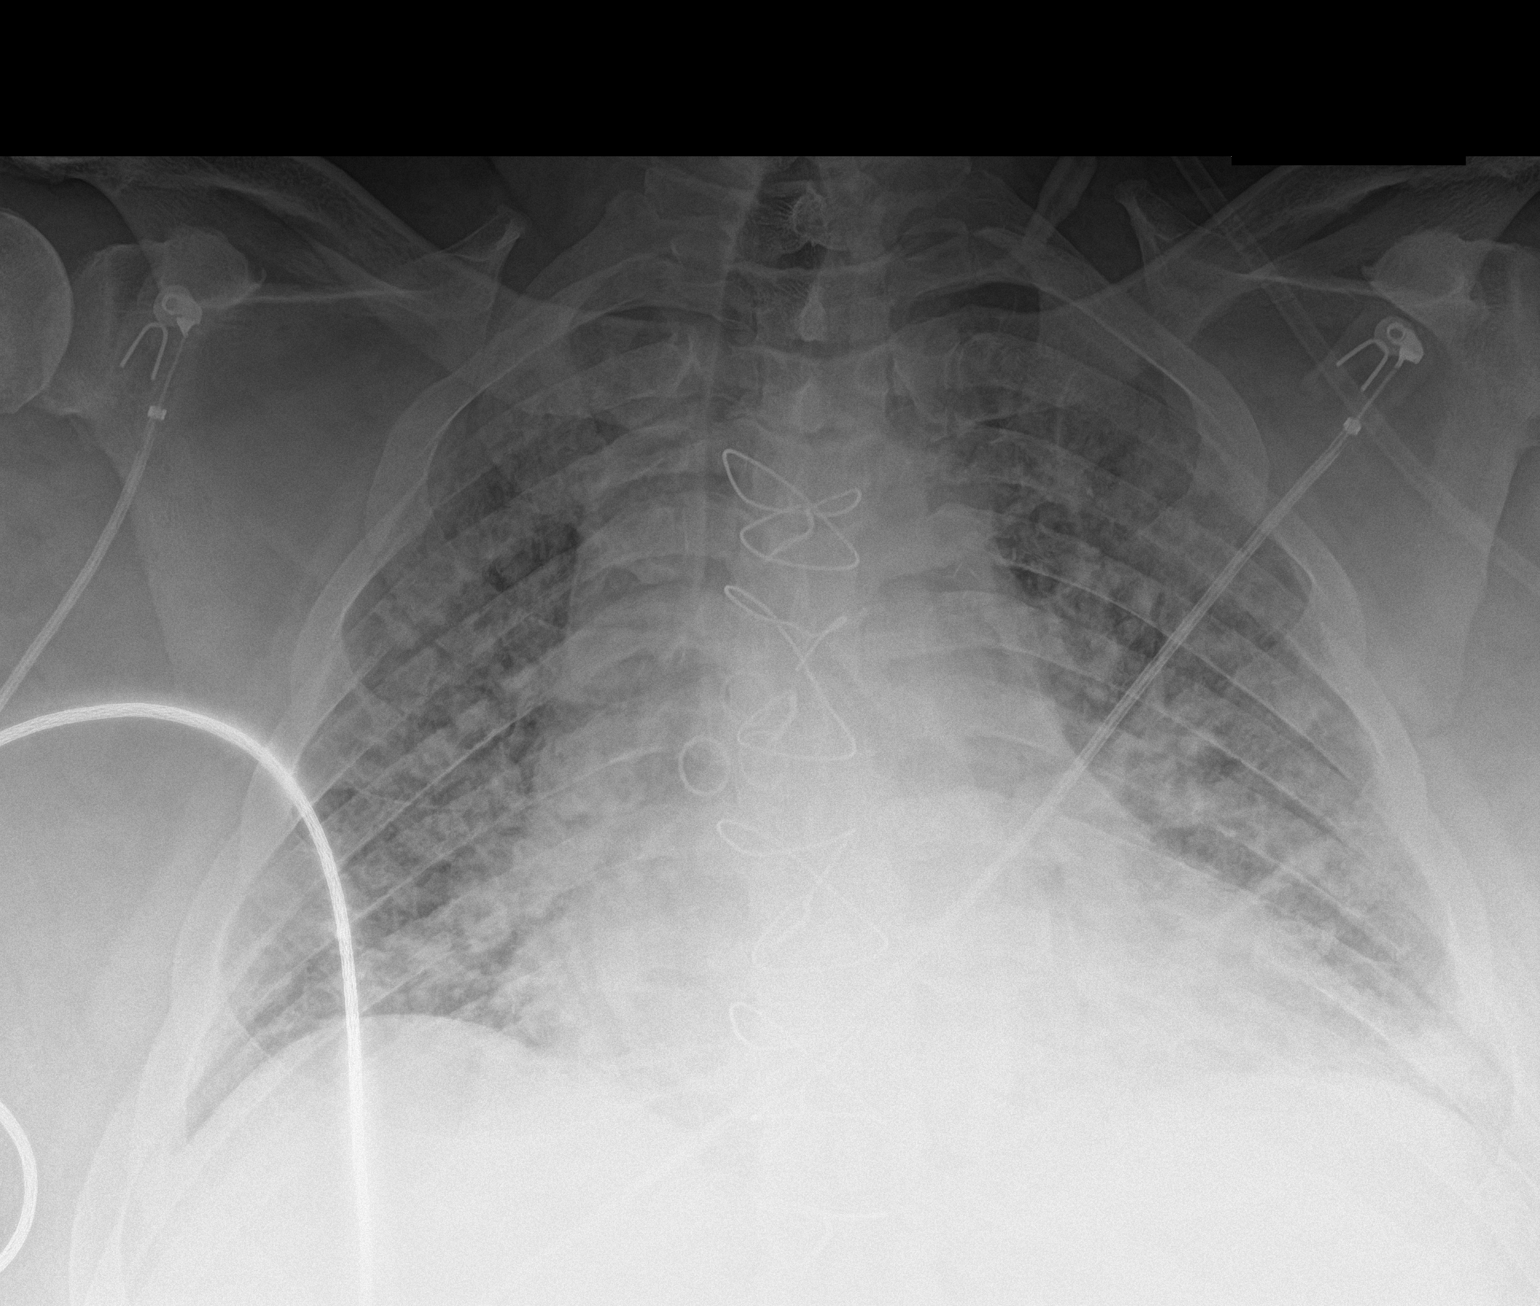

[1 of 1 positions shown; findings below may reference images not displayed]

FINDINGS: Trachea is midline. Heart is enlarged. Diffuse interstitial
prominence indistinctness, with somewhat of a nodular appearance. No
definite pleural fluid.
IMPRESSION: Diffuse interstitial prominence and indistinctness, with somewhat of
a nodular appearance, unchanged from 03/23/2019. Findings may be due
to pulmonary edema but other etiologies such as a viral or atypical
pneumonia are not excluded.

## 2020-11-08 IMAGING — DX DG CHEST 1V PORT
1 series · 1 of 1 positions shown · non-contrast
Comparison: 03/25/2019

CLINICAL DATA: Respiratory failure.

EXAM:
PORTABLE CHEST 1 VIEW

[chest ap]
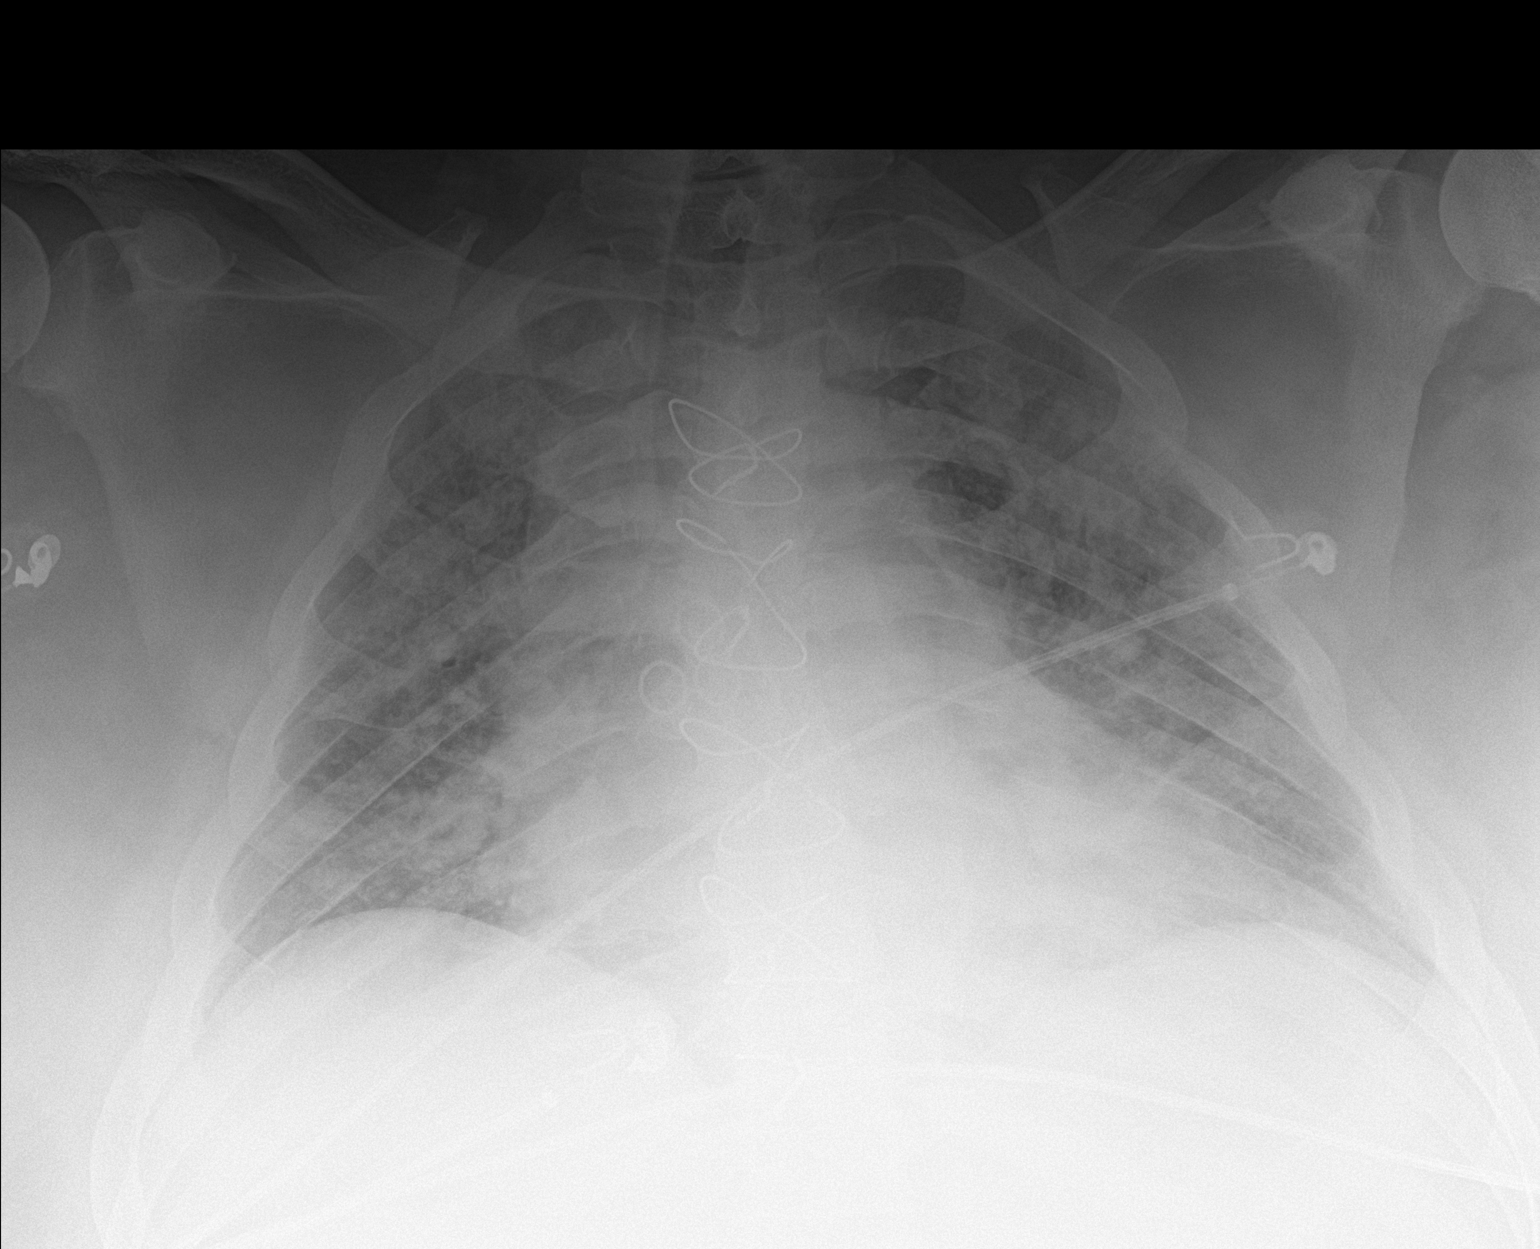

[1 of 1 positions shown; findings below may reference images not displayed]

FINDINGS: The heart is enlarged but stable. Stable surgical changes from
triple bypass surgery. Persistent diffuse interstitial and airspace
process in the lungs. No pneumothorax or pleural effusion.
IMPRESSION: Persistent diffuse interstitial and airspace process.
